# Patient Record
Sex: Male | Born: 1970 | Race: White | Hispanic: No | Marital: Married | Smoking: Never smoker
Health system: Southern US, Community
[De-identification: ages and names within clinical notes are randomized; demographics above are authoritative.]

---

## 2000-10-14 ENCOUNTER — Encounter: Payer: Self-pay | Admitting: Emergency Medicine

## 2000-10-14 ENCOUNTER — Emergency Department (HOSPITAL_COMMUNITY): Admission: EM | Admit: 2000-10-14 | Discharge: 2000-10-14 | Payer: Self-pay | Admitting: Emergency Medicine

## 2003-04-23 ENCOUNTER — Emergency Department (HOSPITAL_COMMUNITY): Admission: EM | Admit: 2003-04-23 | Discharge: 2003-04-23 | Payer: Self-pay | Admitting: Emergency Medicine

## 2009-02-06 ENCOUNTER — Emergency Department (HOSPITAL_COMMUNITY): Admission: EM | Admit: 2009-02-06 | Discharge: 2009-02-06 | Payer: Self-pay | Admitting: Emergency Medicine

## 2010-08-25 ENCOUNTER — Emergency Department (HOSPITAL_COMMUNITY)
Admission: EM | Admit: 2010-08-25 | Discharge: 2010-08-25 | Disposition: A | Discharge status: Home / Self Care | Payer: 59 | Attending: Emergency Medicine | Admitting: Emergency Medicine

## 2010-08-25 ENCOUNTER — Emergency Department (HOSPITAL_COMMUNITY): Payer: 59

## 2010-08-25 DIAGNOSIS — M25579 Pain in unspecified ankle and joints of unspecified foot: Secondary | ICD-10-CM | POA: Insufficient documentation

## 2010-08-25 DIAGNOSIS — S93409A Sprain of unspecified ligament of unspecified ankle, initial encounter: Secondary | ICD-10-CM | POA: Insufficient documentation

## 2010-08-25 DIAGNOSIS — X500XXA Overexertion from strenuous movement or load, initial encounter: Secondary | ICD-10-CM | POA: Insufficient documentation

## 2013-03-10 IMAGING — CR DG ANKLE COMPLETE 3+V*L*
3 series · 3 of 3 positions shown · non-contrast
Comparison: None.

CLINICAL DATA: Twisting ankle injury 1 week ago.  Recent injury.
Lateral pain.

LEFT ANKLE COMPLETE - 3+ VIEW

[t ankle joint ap left]
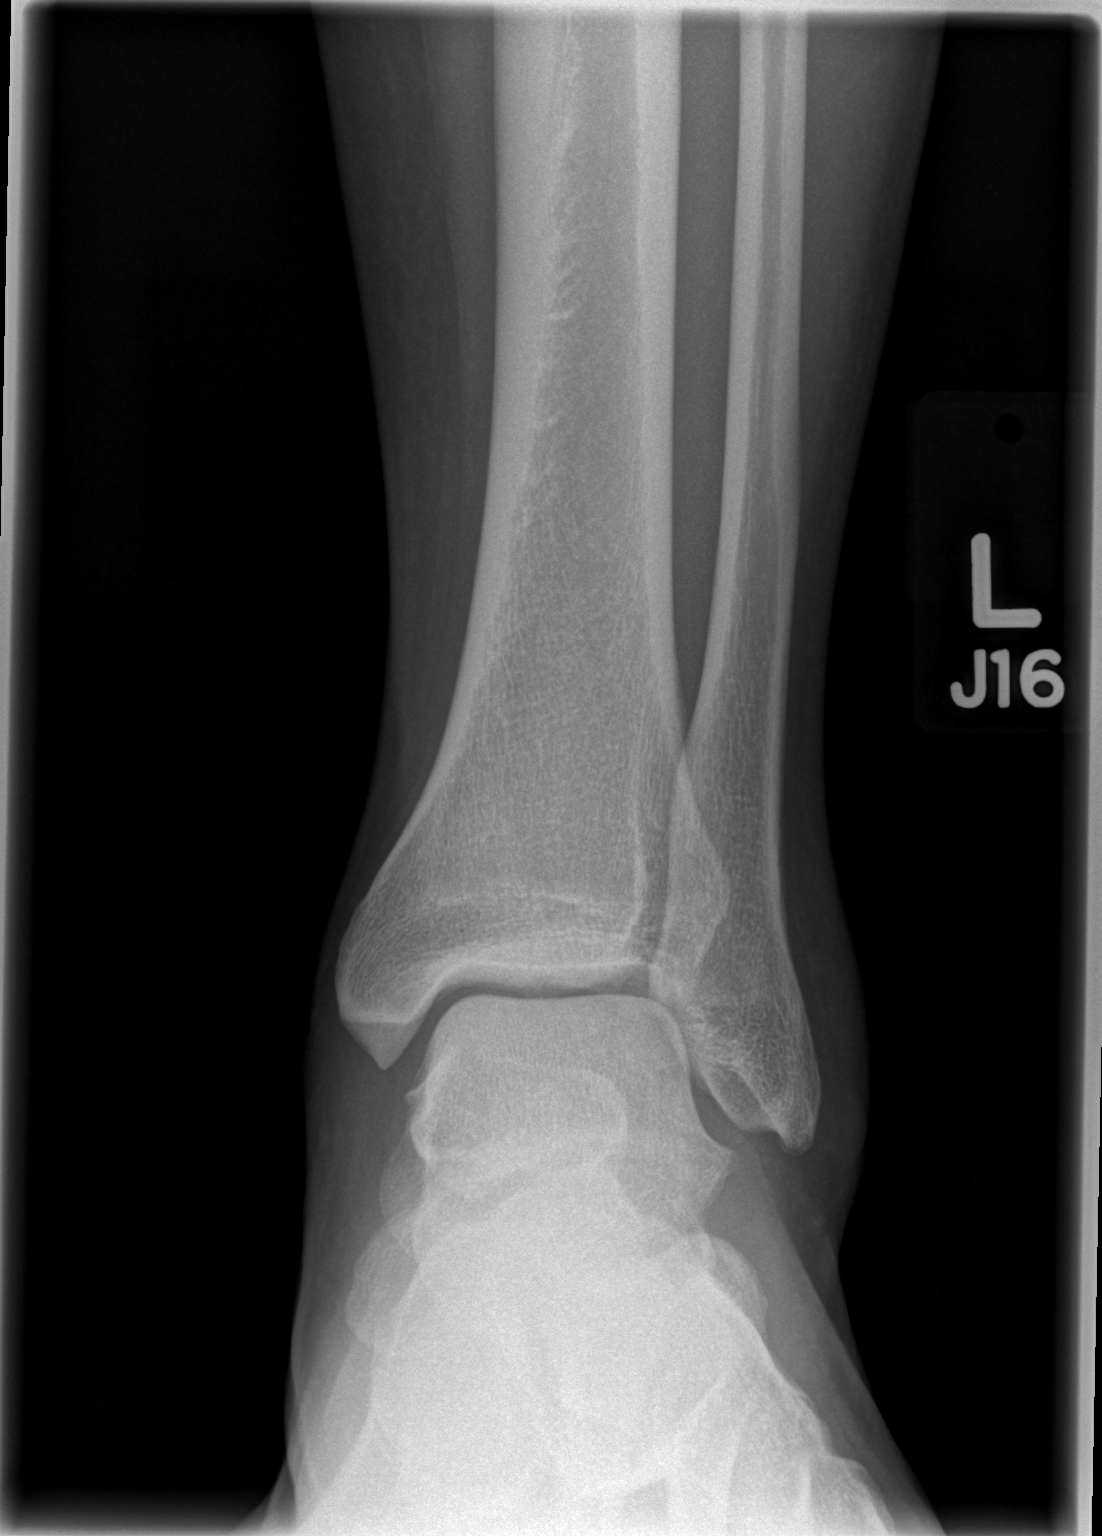

[t ankle joint oblique left]
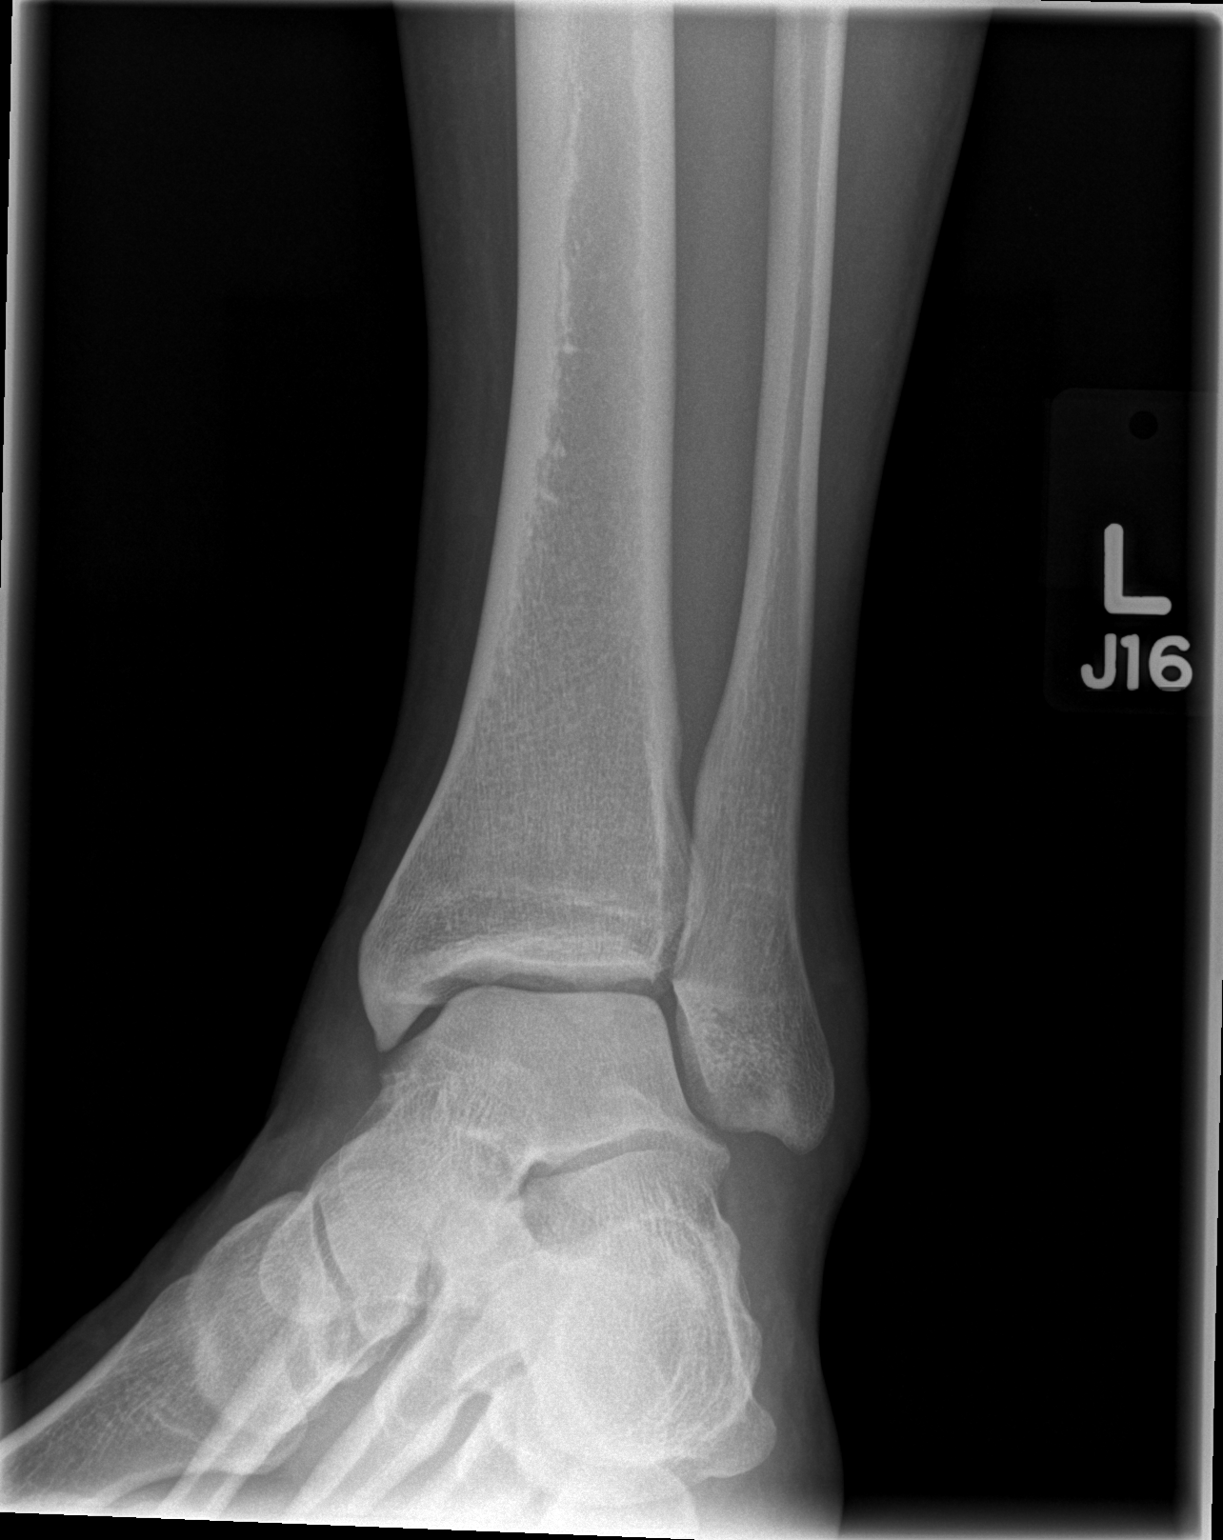

[t ankle joint lat left]
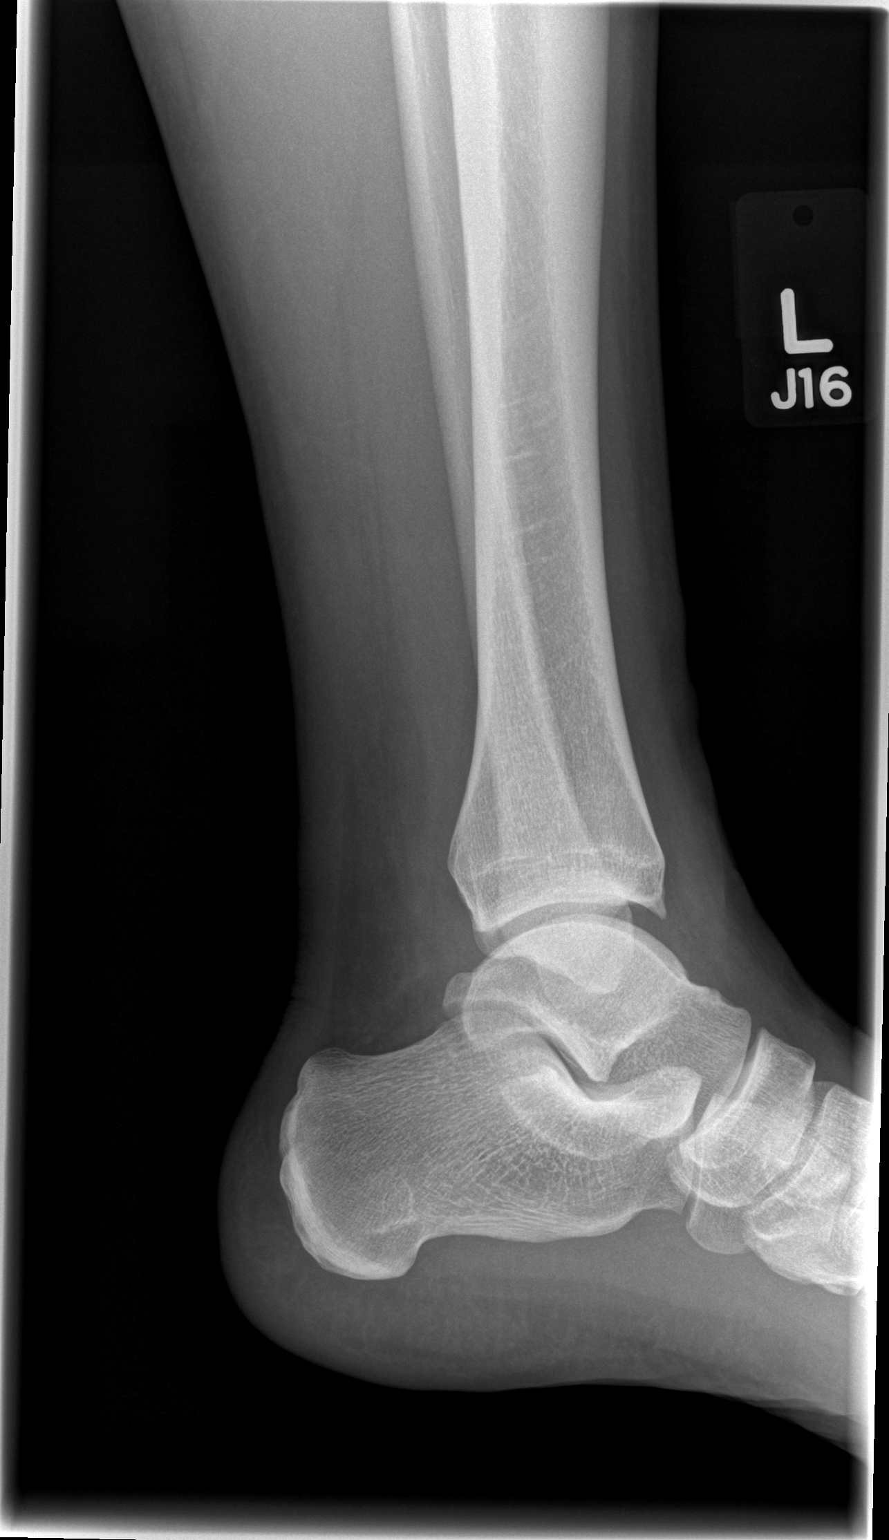

[3 of 3 positions shown; findings below may reference images not displayed]

FINDINGS: Mild soft tissue swelling overlying the lateral malleolus
noted.

The plafond and talar dome appear intact.  No fracture or acute
bony findings identified.
IMPRESSION: 1.  Lateral soft tissue swelling, without underlying fracture
identified.

## 2021-09-18 ENCOUNTER — Encounter (HOSPITAL_BASED_OUTPATIENT_CLINIC_OR_DEPARTMENT_OTHER): Payer: Self-pay

## 2021-09-18 ENCOUNTER — Emergency Department (HOSPITAL_BASED_OUTPATIENT_CLINIC_OR_DEPARTMENT_OTHER)
Admission: EM | Admit: 2021-09-18 | Discharge: 2021-09-18 | Disposition: A | Discharge status: Home / Self Care | Payer: BC Managed Care – PPO | Attending: Emergency Medicine | Admitting: Emergency Medicine

## 2021-09-18 ENCOUNTER — Other Ambulatory Visit (HOSPITAL_BASED_OUTPATIENT_CLINIC_OR_DEPARTMENT_OTHER): Payer: Self-pay

## 2021-09-18 DIAGNOSIS — H6092 Unspecified otitis externa, left ear: Secondary | ICD-10-CM | POA: Insufficient documentation

## 2021-09-18 DIAGNOSIS — H6690 Otitis media, unspecified, unspecified ear: Secondary | ICD-10-CM

## 2021-09-18 DIAGNOSIS — H6692 Otitis media, unspecified, left ear: Secondary | ICD-10-CM | POA: Insufficient documentation

## 2021-09-18 DIAGNOSIS — H60502 Unspecified acute noninfective otitis externa, left ear: Secondary | ICD-10-CM

## 2021-09-18 DIAGNOSIS — H9202 Otalgia, left ear: Secondary | ICD-10-CM | POA: Diagnosis present

## 2021-09-18 MED ORDER — OFLOXACIN 0.3 % OP SOLN
10.0000 [drp] | Freq: Two times a day (BID) | OPHTHALMIC | 0 refills | Status: DC
  Filled 2021-09-18: qty 15, 15d supply, fill #0

## 2021-09-18 MED ORDER — OFLOXACIN 0.3 % OT SOLN: 10.0000 [drp] | Freq: Two times a day (BID) | OTIC | 0 refills | Status: AC

## 2021-09-18 MED ORDER — OFLOXACIN 0.3 % OT SOLN
10.0000 [drp] | Freq: Two times a day (BID) | OTIC | 0 refills | Status: DC
  Filled 2021-09-18: qty 14, 14d supply, fill #0

## 2021-09-18 NOTE — Discharge Instructions (Addendum)
Please use antibiotic eardrops as prescribed, 10 drops into your left ear twice a day for the next 14 days.  Please call and follow-up with ENT at Dr. Jearld Fenton office for further evaluation and management.  Please return to emergency department if you experience worsening pain, drainage from the ear, worsening hearing, or any other concerning symptoms.

## 2021-09-18 NOTE — ED Notes (Signed)
Reviewed AVS/discharge instruction with patient. Time allotted for and all questions answered. Patient is agreeable for d/c and escorted to ed exit by staff.  

## 2021-09-18 NOTE — ED Provider Notes (Signed)
[facility] EMERGENCY DEPT Provider Note   CSN: [id number] Arrival date & time: 09/18/21  1451     History  Chief Complaint  Patient presents with   Otalgia    Rayn Enderson is a 51 y.o. male.  Jeriah Corkum is a 51 year old male with no past medical history who presents with 2 days of left ear pain and decreased hearing.  He states he sneezed and blew his nose and felt a pop in both of his ears very similar to when he has relieved the pressure in his ears but his left ear has continued to feel full and painful since then.  He notes some pink material on a Q-tip that he stuck in his ear yesterday.  He denies any dizziness, recent illnesses, fever, chills, cough, sore throat, vision changes, obvious drainage from his ear.  He has not recently been swimming, had any trauma to his left ear, or listened to loud music.  He has no history of ear infections.   Otalgia Associated symptoms: no abdominal pain, no cough, no fever, no headaches and no sore throat        Home Medications Prior to Admission medications   Medication Sig Start Date End Date Taking? Authorizing Provider  ofloxacin (FLOXIN) 0.3 % OTIC solution Place 10 drops into the left ear 2 (two) times daily for 14 days. 09/18/21 10/02/21  Rocky Morel, DO      Allergies    Penicillins    Review of Systems   Review of Systems  Constitutional:  Negative for chills, fatigue and fever.  HENT:  Positive for ear pain. Negative for sore throat.   Eyes:  Negative for visual disturbance.  Respiratory:  Negative for cough and shortness of breath.   Cardiovascular:  Negative for chest pain.  Gastrointestinal:  Negative for abdominal pain.  Genitourinary:  Negative for difficulty urinating.  Neurological:  Negative for dizziness, syncope, light-headedness and headaches.    Physical Exam Updated Vital Signs BP 134/87   Pulse 70   Temp (!) 97.4 F (36.3 C) (Oral)   Resp 16   SpO2 100%  Physical Exam Vitals  reviewed.  Constitutional:      General: He is not in acute distress.    Appearance: Normal appearance. He is normal weight.  HENT:     Head: Normocephalic and atraumatic.     Right Ear: Tympanic membrane, ear canal and external ear normal.     Left Ear: External ear normal.     Ears:     Comments: In the left ear there is apparent white-ish tissue edema that obstructs full view of the tympanic membrane.  There is also air bubbling and whitish discharge seen in the ear canal.  No blood seen in the left ear canal. Neurological:     Mental Status: He is alert.     ED Results / Procedures / Treatments   Labs (all labs ordered are listed, but only abnormal results are displayed) Labs Reviewed - No data to display  EKG None  Radiology No results found.  Procedures Procedures    Medications Ordered in ED Medications - No data to display  ED Course/ Medical Decision Making/ A&P                           Medical Decision Making Uzair Godley is a 51 year old male who presents with 2 days of left ear pain and decreased hearing.  There are no obvious  signs of systemic infection, central nervous system involvement, or vestibular dysfunction.  Tympanic membrane rupture cannot be ruled out due to obstructed view on exam.  Patient treated with a prescription for ofloxacin otic drops and referred to ENT for further evaluation and management.  Patient discharged stable condition and all questions were answered prior to discharge.  Risk Prescription drug management.          Final Clinical Impression(s) / ED Diagnoses Final diagnoses:  Acute otitis media, unspecified otitis media type  Acute otitis externa of left ear, unspecified type    Rx / DC Orders ED Discharge Orders          Ordered    ofloxacin (OCUFLOX) 0.3 % ophthalmic solution  2 times daily,   Status:  Discontinued        09/18/21 1728    ofloxacin (FLOXIN) 0.3 % OTIC solution  2 times daily,   Status:   Discontinued        09/18/21 1741    ofloxacin (FLOXIN) 0.3 % OTIC solution  2 times daily        09/18/21 1759              Rocky Morel, DO 09/18/21 1832    Tegeler, Canary Brim, MD 09/19/21 1125

## 2021-09-18 NOTE — ED Triage Notes (Signed)
Pt presents POV from home with Left ear pain x2 days.Pt fel a pop today when he sneezed

## 2021-09-19 ENCOUNTER — Other Ambulatory Visit (HOSPITAL_BASED_OUTPATIENT_CLINIC_OR_DEPARTMENT_OTHER): Payer: Self-pay
